# Patient Record
Sex: Female | Born: 1975 | Race: Black or African American | Hispanic: No
Health system: Southern US, Community
[De-identification: ages and names within clinical notes are randomized; demographics above are authoritative.]

---

## 2006-12-24 ENCOUNTER — Emergency Department (HOSPITAL_COMMUNITY): Admission: EM | Admit: 2006-12-24 | Discharge: 2006-12-24 | Payer: Self-pay | Admitting: Emergency Medicine
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
## 2010-02-10 ENCOUNTER — Encounter: Payer: Self-pay | Admitting: Chiropractic Medicine

## 2019-01-26 ENCOUNTER — Emergency Department (HOSPITAL_COMMUNITY)
Admission: EM | Admit: 2019-01-26 | Discharge: 2019-01-26 | Disposition: A | Discharge status: Home / Self Care | Payer: Self-pay | Attending: Emergency Medicine | Admitting: Emergency Medicine

## 2019-01-26 ENCOUNTER — Encounter (HOSPITAL_COMMUNITY): Payer: Self-pay

## 2019-01-26 ENCOUNTER — Other Ambulatory Visit: Payer: Self-pay

## 2019-01-26 DIAGNOSIS — W5501XA Bitten by cat, initial encounter: Secondary | ICD-10-CM | POA: Insufficient documentation

## 2019-01-26 DIAGNOSIS — Y93K9 Activity, other involving animal care: Secondary | ICD-10-CM | POA: Insufficient documentation

## 2019-01-26 DIAGNOSIS — Z23 Encounter for immunization: Secondary | ICD-10-CM | POA: Insufficient documentation

## 2019-01-26 DIAGNOSIS — Y92810 Car as the place of occurrence of the external cause: Secondary | ICD-10-CM | POA: Insufficient documentation

## 2019-01-26 DIAGNOSIS — S61451A Open bite of right hand, initial encounter: Secondary | ICD-10-CM | POA: Insufficient documentation

## 2019-01-26 DIAGNOSIS — Y998 Other external cause status: Secondary | ICD-10-CM | POA: Insufficient documentation

## 2019-01-26 MED ORDER — TETANUS-DIPHTH-ACELL PERTUSSIS 5-2.5-18.5 LF-MCG/0.5 IM SUSP
0.5000 mL | Freq: Once | INTRAMUSCULAR | Status: AC
  Administered 2019-01-26: 21:00:00 0.5 mL via INTRAMUSCULAR
  Filled 2019-01-26: qty 0.5

## 2019-01-26 MED ORDER — AMOXICILLIN-POT CLAVULANATE 875-125 MG PO TABS: 1.0000 | ORAL_TABLET | Freq: Two times a day (BID) | ORAL | 0 refills | Status: AC

## 2019-01-26 MED ORDER — AMOXICILLIN-POT CLAVULANATE 875-125 MG PO TABS
1.0000 | ORAL_TABLET | Freq: Once | ORAL | Status: AC
Start: 2019-01-26 — End: 2019-01-26
  Administered 2019-01-26: 21:00:00 1 via ORAL
  Filled 2019-01-26: qty 1

## 2019-01-26 NOTE — ED Notes (Signed)
Multiple cat scratches and bites noted, rerpots from a stray cat
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
## 2019-01-26 NOTE — Discharge Instructions (Signed)
Please take antibiotic as prescribed for treatment of cat bite, which is a high risk for infection.  Contact animal control and request for close monitoring of the cat, and return if you need rabies coverage.  Take ibuprofen or tylenol as needed for pain.  Cleanse wound thoroughly at home.
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
## 2019-08-15 ENCOUNTER — Ambulatory Visit: Payer: Medicaid Other | Attending: Internal Medicine

## 2019-08-15 DIAGNOSIS — Z20822 Contact with and (suspected) exposure to covid-19: Secondary | ICD-10-CM | POA: Insufficient documentation

## 2019-08-16 LAB — SARS-COV-2, NAA 2 DAY TAT

## 2019-08-16 LAB — NOVEL CORONAVIRUS, NAA: SARS-CoV-2, NAA: NOT DETECTED
